# Patient Record
Sex: Female | Born: 1950 | Race: Black or African American | Hispanic: No | Marital: Married | Smoking: Never smoker
Health system: Southern US, Community
[De-identification: ages and names within clinical notes are randomized; demographics above are authoritative.]

## PROBLEM LIST (undated history)

## (undated) DIAGNOSIS — J45909 Unspecified asthma, uncomplicated: Secondary | ICD-10-CM

## (undated) DIAGNOSIS — G4730 Sleep apnea, unspecified: Secondary | ICD-10-CM

## (undated) DIAGNOSIS — E079 Disorder of thyroid, unspecified: Secondary | ICD-10-CM

## (undated) DIAGNOSIS — M1990 Unspecified osteoarthritis, unspecified site: Secondary | ICD-10-CM

## (undated) DIAGNOSIS — I10 Essential (primary) hypertension: Secondary | ICD-10-CM

## (undated) DIAGNOSIS — D649 Anemia, unspecified: Secondary | ICD-10-CM

## (undated) DIAGNOSIS — E785 Hyperlipidemia, unspecified: Secondary | ICD-10-CM

## (undated) DIAGNOSIS — T7840XA Allergy, unspecified, initial encounter: Secondary | ICD-10-CM

## (undated) DIAGNOSIS — H269 Unspecified cataract: Secondary | ICD-10-CM

## (undated) HISTORY — DX: Unspecified osteoarthritis, unspecified site: M19.90

## (undated) HISTORY — PX: COLONOSCOPY: SHX174

## (undated) HISTORY — DX: Hyperlipidemia, unspecified: E78.5

## (undated) HISTORY — DX: Anemia, unspecified: D64.9

## (undated) HISTORY — DX: Unspecified cataract: H26.9

## (undated) HISTORY — PX: KNEE SURGERY: SHX244

## (undated) HISTORY — DX: Allergy, unspecified, initial encounter: T78.40XA

## (undated) HISTORY — PX: CARPAL TUNNEL RELEASE: SHX101

## (undated) HISTORY — PX: ABDOMINAL HYSTERECTOMY: SHX81

## (undated) HISTORY — PX: SHOULDER SURGERY: SHX246

---

## 2010-04-01 ENCOUNTER — Emergency Department (HOSPITAL_COMMUNITY): Admission: EM | Admit: 2010-04-01 | Discharge: 2010-04-01 | Payer: Self-pay | Admitting: Emergency Medicine

## 2012-08-15 ENCOUNTER — Encounter (HOSPITAL_COMMUNITY): Payer: Self-pay | Admitting: Emergency Medicine

## 2012-08-15 ENCOUNTER — Emergency Department (HOSPITAL_COMMUNITY): Payer: Medicare HMO

## 2012-08-15 ENCOUNTER — Emergency Department (HOSPITAL_COMMUNITY)
Admission: EM | Admit: 2012-08-15 | Discharge: 2012-08-15 | Disposition: A | Discharge status: Home / Self Care | Payer: Medicare HMO | Attending: Emergency Medicine | Admitting: Emergency Medicine

## 2012-08-15 DIAGNOSIS — Z79899 Other long term (current) drug therapy: Secondary | ICD-10-CM | POA: Insufficient documentation

## 2012-08-15 DIAGNOSIS — J45909 Unspecified asthma, uncomplicated: Secondary | ICD-10-CM | POA: Insufficient documentation

## 2012-08-15 DIAGNOSIS — I10 Essential (primary) hypertension: Secondary | ICD-10-CM | POA: Insufficient documentation

## 2012-08-15 DIAGNOSIS — M79609 Pain in unspecified limb: Secondary | ICD-10-CM

## 2012-08-15 DIAGNOSIS — M62838 Other muscle spasm: Secondary | ICD-10-CM | POA: Insufficient documentation

## 2012-08-15 HISTORY — DX: Sleep apnea, unspecified: G47.30

## 2012-08-15 HISTORY — DX: Essential (primary) hypertension: I10

## 2012-08-15 LAB — CBC WITH DIFFERENTIAL/PLATELET
Basophils Absolute: 0 10*3/uL (ref 0.0–0.1)
Basophils Relative: 0 % (ref 0–1)
MCHC: 34.3 g/dL (ref 30.0–36.0)
Monocytes Absolute: 0.5 10*3/uL (ref 0.1–1.0)
Neutro Abs: 4.3 10*3/uL (ref 1.7–7.7)
Neutrophils Relative %: 58 % (ref 43–77)
RDW: 13.3 % (ref 11.5–15.5)

## 2012-08-15 LAB — POCT I-STAT, CHEM 8
Calcium, Ion: 1.36 mmol/L — ABNORMAL HIGH (ref 1.13–1.30)
Chloride: 104 mEq/L (ref 96–112)
HCT: 40 % (ref 36.0–46.0)
Potassium: 4.7 mEq/L (ref 3.5–5.1)

## 2012-08-15 LAB — POCT I-STAT TROPONIN I: Troponin i, poc: 0 ng/mL (ref 0.00–0.08)

## 2012-08-15 MED ORDER — IBUPROFEN 600 MG PO TABS: 600.0000 mg | ORAL_TABLET | Freq: Three times a day (TID) | ORAL | Status: DC | PRN

## 2012-08-15 MED ORDER — CYCLOBENZAPRINE HCL 10 MG PO TABS: 10.0000 mg | ORAL_TABLET | Freq: Three times a day (TID) | ORAL | Status: DC | PRN

## 2012-08-15 MED ORDER — IBUPROFEN 800 MG PO TABS
800.0000 mg | ORAL_TABLET | Freq: Once | ORAL | Status: AC
  Administered 2012-08-15: 800 mg via ORAL
  Filled 2012-08-15: qty 1

## 2012-08-15 MED ORDER — CYCLOBENZAPRINE HCL 10 MG PO TABS
10.0000 mg | ORAL_TABLET | Freq: Once | ORAL | Status: AC
  Administered 2012-08-15: 10 mg via ORAL
  Filled 2012-08-15: qty 1

## 2012-08-15 NOTE — ED Provider Notes (Signed)
History     CSN: [id number]  Arrival date & time 08/15/12  1213   First MD Initiated Contact with Patient 08/15/12 1317      Chief Complaint  Patient presents with  . Shoulder Pain    left side  . Leg Pain    left leg    (Consider location/radiation/quality/duration/timing/severity/associated sxs/prior treatment) HPI  Patient relates she started having pressure in her left posterior shoulder blade yesterday. She states the discomfort is constant. She states sometimes she has tingling in her left fingers. She also complains of pain in her left leg and points to her upper calf. She states it's harder to walk because of pain. She denies any low back pain. She denies headache, chest pain, or fever. She has had a cough for months. She states she has shortness of breath with the pain. She has a history of asthma in states she's having DOE.   PCP [facility] in [place]  Past Medical History  Diagnosis Date  . Hypertension   . Sleep apnea    asthma  Past Surgical History  Procedure Laterality Date  . Shoulder surgery    . Knee surgery    . Abdominal hysterectomy    . Carpal tunnel release      No family history on file.  History  Substance Use Topics  . Smoking status: Never Smoker   . Smokeless tobacco: Not on file  . Alcohol Use: No  Lives at home Lives with grandson CPAP at night   OB History   Grav Para Term Preterm Abortions TAB SAB Ect Mult Living                  Review of Systems  All other systems reviewed and are negative.    Allergies  Aspirin; Codeine; and Penicillins  Home Medications   Current Outpatient Rx  Name  Route  Sig  Dispense  Refill  . Cholecalciferol (VITAMIN D-3) 1000 UNITS CAPS   Oral   Take 1 capsule by mouth daily.         . fluticasone (FLOVENT HFA) 110 MCG/ACT inhaler   Inhalation   Inhale 1 puff into the lungs 2 (two) times daily as needed (for shortness of breath).         Marland Kitchen levothyroxine  (SYNTHROID, LEVOTHROID) 200 MCG tablet   Oral   Take 200 mcg by mouth daily.         Marland Kitchen levothyroxine (SYNTHROID, LEVOTHROID) 50 MCG tablet   Oral   Take 50 mcg by mouth daily.         Marland Kitchen lisinopril-hydrochlorothiazide (PRINZIDE,ZESTORETIC) 10-12.5 MG per tablet   Oral   Take 1 tablet by mouth daily.           BP 160/90  Pulse 82  Temp(Src) 98.3 F (36.8 C) (Oral)  Resp 18  SpO2 99%  Vital signs normal    Physical Exam  Nursing note and vitals reviewed. Constitutional: She is oriented to person, place, and time. She appears well-developed and well-nourished.  Non-toxic appearance. She does not appear ill. No distress.  HENT:  Head: Normocephalic and atraumatic.  Right Ear: External ear normal.  Left Ear: External ear normal.  Nose: Nose normal. No mucosal edema or rhinorrhea.  Mouth/Throat: Oropharynx is clear and moist and mucous membranes are normal. No dental abscesses or edematous.  Eyes: Conjunctivae and EOM are normal. Pupils are equal, round, and reactive to light.  Neck: Normal range of motion  and full passive range of motion without pain. Neck supple.    Cardiovascular: Normal rate, regular rhythm and normal heart sounds.  Exam reveals no gallop and no friction rub.   No murmur heard. Pulmonary/Chest: Effort normal and breath sounds normal. No respiratory distress. She has no wheezes. She has no rhonchi. She has no rales. She exhibits no tenderness and no crepitus.  Abdominal: Soft. Normal appearance and bowel sounds are normal. She exhibits no distension. There is no tenderness. There is no rebound and no guarding.  Musculoskeletal: Normal range of motion. She exhibits no edema and no tenderness.       Back:       Legs: Moves all extremities well. Has some varicose veins, tender in her prox left calf without cords, tender over the course of the trapezius on the left  Nontender in the midline thoracic or lumbar spine. Also nontender in the cervical spine.    Neurological: She is alert and oriented to person, place, and time. She has normal strength. No cranial nerve deficit.  Skin: Skin is warm, dry and intact. No rash noted. No erythema. No pallor.  Psychiatric: She has a normal mood and affect. Her speech is normal and behavior is normal. Her mood appears not anxious.    ED Course  Procedures (including critical care time)  Medications  cyclobenzaprine (FLEXERIL) tablet 10 mg (10 mg Oral Given 08/15/12 1528)  ibuprofen (ADVIL,MOTRIN) tablet 800 mg (800 mg Oral Given 08/15/12 1529)   Pt states her pain is better.       VASCULAR LAB  PRELIMINARY PRELIMINARY PRELIMINARY PRELIMINARY  Left lower extremity venous Doppler completed.  Preliminary report: There is no obvious evidence of DVT or SVT noted in the left lower extremity.  KANADY, CANDACE, RVT  08/15/2012, 4:25 PM        Results for orders placed during the hospital encounter of 08/15/12  CBC WITH DIFFERENTIAL      Result Value Range   WBC 7.3  4.0 - 10.5 K/uL   RBC 4.59  3.87 - 5.11 MIL/uL   Hemoglobin 13.6  12.0 - 15.0 g/dL   HCT 04.5  40.9 - 81.1 %   MCV 86.3  78.0 - 100.0 fL   MCH 29.6  26.0 - 34.0 pg   MCHC 34.3  30.0 - 36.0 g/dL   RDW 91.4  78.2 - 95.6 %   Platelets 254  150 - 400 K/uL   Neutrophils Relative 58  43 - 77 %   Neutro Abs 4.3  1.7 - 7.7 K/uL   Lymphocytes Relative 34  12 - 46 %   Lymphs Abs 2.5  0.7 - 4.0 K/uL   Monocytes Relative 6  3 - 12 %   Monocytes Absolute 0.5  0.1 - 1.0 K/uL   Eosinophils Relative 1  0 - 5 %   Eosinophils Absolute 0.1  0.0 - 0.7 K/uL   Basophils Relative 0  0 - 1 %   Basophils Absolute 0.0  0.0 - 0.1 K/uL  POCT I-STAT, CHEM 8      Result Value Range   Sodium 142  135 - 145 mEq/L   Potassium 4.7  3.5 - 5.1 mEq/L   Chloride 104  96 - 112 mEq/L   BUN 15  6 - 23 mg/dL   Creatinine, Ser 2.13  0.50 - 1.10 mg/dL   Glucose, Bld 95  70 - 99 mg/dL   Calcium, Ion 0.86 (*) 1.13 - 1.30 mmol/L   TCO2  37  0 - 100 mmol/L    Hemoglobin 13.6  12.0 - 15.0 g/dL   HCT 16.1  09.6 - 04.5 %  POCT I-STAT TROPONIN I      Result Value Range   Troponin i, poc 0.00  0.00 - 0.08 ng/mL   Comment 3             Laboratory interpretation all normal   Dg Chest 2 View  08/15/2012  *RADIOLOGY REPORT*  Clinical Data: Cough and left-sided pain.  CHEST - 2 VIEW  Comparison: None  Findings: Upper limits normal heart size noted. The right hilar region is mildly prominent - suspect overlapping vascular structures. Minimal left basilar atelectasis is noted. There is no evidence of focal airspace disease, pulmonary edema, suspicious pulmonary nodule/mass, pleural effusion, or pneumothorax. No acute bony abnormalities are identified.  IMPRESSION: Minimal left basilar atelectasis.  Mildly prominent right hilar region - suspect overlapping vascular structures.  Recommend short-term radiographic follow up.   Original Report Authenticated By: Harmon Pier, M.D.        Date: 08/15/2012  Rate: 75  Rhythm: normal sinus rhythm  QRS Axis: left  Intervals: normal  ST/T Wave abnormalities: normal  Conduction Disutrbances:none  Narrative Interpretation:   Old EKG Reviewed: none available    1. Trapezius muscle spasm     New Prescriptions   CYCLOBENZAPRINE (FLEXERIL) 10 MG TABLET    Take 1 tablet (10 mg total) by mouth 3 (three) times daily as needed for muscle spasms.   IBUPROFEN (ADVIL,MOTRIN) 600 MG TABLET    Take 1 tablet (600 mg total) by mouth every 8 (eight) hours as needed for pain.   Plan discharge  Devoria Albe, MD, FACEP   MDM          Ward Givens, MD 08/15/12 [phone]

## 2012-08-15 NOTE — ED Notes (Signed)
Pt c/o left shoulder and left leg pain onset yesterday. Pt also c/o numbness and tingling to fingers on left hand. Pt just started on BP meds recently.

## 2012-08-15 NOTE — ED Notes (Signed)
Doppler at bedside.

## 2012-08-15 NOTE — ED Notes (Signed)
Pt dc'd home w/all belongings, pt alert and ambulatory upon dc, pt verbalizes understanding of dc instructions, pt driven home by family. 2 new rx given

## 2012-08-15 NOTE — ED Notes (Signed)
Patient transported to X-ray 

## 2012-08-15 NOTE — Progress Notes (Signed)
VASCULAR LAB PRELIMINARY  PRELIMINARY  PRELIMINARY  PRELIMINARY  Left lower extremity venous Doppler completed.    Preliminary report:  There is no obvious evidence of DVT or SVT noted in the left lower extremity.  Micky Overturf, RVT 08/15/2012, 4:25 PM

## 2013-02-03 ENCOUNTER — Encounter (HOSPITAL_COMMUNITY): Payer: Self-pay | Admitting: Emergency Medicine

## 2013-02-03 DIAGNOSIS — R197 Diarrhea, unspecified: Secondary | ICD-10-CM | POA: Insufficient documentation

## 2013-02-03 DIAGNOSIS — R1110 Vomiting, unspecified: Secondary | ICD-10-CM | POA: Insufficient documentation

## 2013-02-03 DIAGNOSIS — I10 Essential (primary) hypertension: Secondary | ICD-10-CM | POA: Insufficient documentation

## 2013-02-03 DIAGNOSIS — M109 Gout, unspecified: Secondary | ICD-10-CM | POA: Insufficient documentation

## 2013-02-03 DIAGNOSIS — E039 Hypothyroidism, unspecified: Secondary | ICD-10-CM | POA: Insufficient documentation

## 2013-02-03 DIAGNOSIS — Z79899 Other long term (current) drug therapy: Secondary | ICD-10-CM | POA: Insufficient documentation

## 2013-02-03 DIAGNOSIS — Z8669 Personal history of other diseases of the nervous system and sense organs: Secondary | ICD-10-CM | POA: Insufficient documentation

## 2013-02-03 DIAGNOSIS — M79609 Pain in unspecified limb: Secondary | ICD-10-CM | POA: Insufficient documentation

## 2013-02-03 NOTE — ED Notes (Signed)
Pt. reports nausea with vomitting and diarrhea last Wednesday and left big toe pain with swelling onset today .

## 2013-02-04 ENCOUNTER — Emergency Department (HOSPITAL_COMMUNITY)
Admission: EM | Admit: 2013-02-04 | Discharge: 2013-02-04 | Disposition: A | Discharge status: Home / Self Care | Payer: Medicare HMO | Attending: Emergency Medicine | Admitting: Emergency Medicine

## 2013-02-04 ENCOUNTER — Emergency Department (HOSPITAL_COMMUNITY): Payer: Medicare HMO

## 2013-02-04 DIAGNOSIS — M79675 Pain in left toe(s): Secondary | ICD-10-CM

## 2013-02-04 DIAGNOSIS — M109 Gout, unspecified: Secondary | ICD-10-CM

## 2013-02-04 HISTORY — DX: Unspecified asthma, uncomplicated: J45.909

## 2013-02-04 HISTORY — DX: Disorder of thyroid, unspecified: E07.9

## 2013-02-04 MED ORDER — OXYCODONE-ACETAMINOPHEN 5-325 MG PO TABS
2.0000 | ORAL_TABLET | Freq: Once | ORAL | Status: AC
  Administered 2013-02-04: 2 via ORAL
  Filled 2013-02-04: qty 2

## 2013-02-04 MED ORDER — PREDNISONE 20 MG PO TABS
60.0000 mg | ORAL_TABLET | Freq: Once | ORAL | Status: AC
  Administered 2013-02-04: 60 mg via ORAL

## 2013-02-04 MED ORDER — OXYCODONE-ACETAMINOPHEN 5-325 MG PO TABS: 1.0000 | ORAL_TABLET | ORAL | Status: DC | PRN

## 2013-02-04 MED ORDER — PREDNISONE 20 MG PO TABS: 60.0000 mg | ORAL_TABLET | Freq: Every day | ORAL | Status: DC

## 2013-02-04 NOTE — ED Provider Notes (Signed)
TIME SEEN: 12:11AM  CHIEF COMPLAINT: Left great toe pain  HPI: Patient is a 62 y.o. female with a history of hypothyroidism, hypertension who presents the emergency department with complaints of left great toe pain that started earlier today. No known injury. She's never had similar symptoms. No prior history of gout. No fever. She did have vomiting and diarrhea last week but has been resolved for the past 3 days. No abdominal pain. No numbness, tingling or focal weakness. No joint swelling.  ROS: See HPI Constitutional: no fever  Eyes: no drainage  ENT: no runny nose   Cardiovascular:  no chest pain  Resp: no SOB  GI: no vomiting GU: no dysuria Integumentary: no rash  Allergy: no hives  Musculoskeletal: no leg swelling  Neurological: no slurred speech ROS otherwise negative  PAST MEDICAL HISTORY/PAST SURGICAL HISTORY:  Past Medical History  Diagnosis Date  . Hypertension   . Sleep apnea   . Thyroid disease   . Asthma     MEDICATIONS:  Prior to Admission medications   Medication Sig Start Date End Date Taking? Authorizing Provider  Cholecalciferol (VITAMIN D-3) 1000 UNITS CAPS Take 1 capsule by mouth daily.    Historical Provider, MD  cyclobenzaprine (FLEXERIL) 10 MG tablet Take 1 tablet (10 mg total) by mouth 3 (three) times daily as needed for muscle spasms. 08/15/12   Charmaine Placido Givens, MD  fluticasone (FLOVENT HFA) 110 MCG/ACT inhaler Inhale 1 puff into the lungs 2 (two) times daily as needed (for shortness of breath).    Historical Provider, MD  ibuprofen (ADVIL,MOTRIN) 600 MG tablet Take 1 tablet (600 mg total) by mouth every 8 (eight) hours as needed for pain. 08/15/12   Griff Badley Givens, MD  levothyroxine (SYNTHROID, LEVOTHROID) 200 MCG tablet Take 200 mcg by mouth daily.    Historical Provider, MD  levothyroxine (SYNTHROID, LEVOTHROID) 50 MCG tablet Take 50 mcg by mouth daily.    Historical Provider, MD  lisinopril-hydrochlorothiazide (PRINZIDE,ZESTORETIC) 10-12.5 MG per tablet Take  1 tablet by mouth daily.    Historical Provider, MD    ALLERGIES:  Allergies  Allergen Reactions  . Aspirin     swelling  . Codeine Other (See Comments)    Unknown   . Penicillins Other (See Comments)    Childhood reaction    SOCIAL HISTORY:  History  Substance Use Topics  . Smoking status: Never Smoker   . Smokeless tobacco: Not on file  . Alcohol Use: No    FAMILY HISTORY: No family history on file.  EXAM: BP 156/69  Pulse 79  Temp(Src) 98.7 F (37.1 C) (Oral)  Resp 16  Ht 5\' 2"  (1.575 m)  Wt 269 lb (122.018 kg)  BMI 49.19 kg/m2  SpO2 96% CONSTITUTIONAL: Alert and oriented and responds appropriately to questions. Well-appearing; well-nourished HEAD: Normocephalic EYES: Conjunctivae clear, PERRL ENT: normal nose; no rhinorrhea; moist mucous membranes; pharynx without lesions noted NECK: Supple, no meningismus, no LAD  CARD: RRR; S1 and S2 appreciated; no murmurs, no clicks, no rubs, no gallops RESP: Normal chest excursion without splinting or tachypnea; breath sounds clear and equal bilaterally; no wheezes, no rhonchi, no rales,  ABD/GI: Normal bowel sounds; non-distended; soft, non-tender, no rebound, no guarding BACK:  The back appears normal and is non-tender to palpation, there is no CVA tenderness EXT: Tenderness palpation over the first MTP with no joint effusion, very mild erythema and warmth, mild pain with movement of her first MTP, normal sensation, normal capillary refill, 2+ DP pulses, otherwise  Normal ROM in all joints; non-tender to palpation; no edema; normal capillary refill; no cyanosis    SKIN: Normal color for age and race; warm NEURO: Moves all extremities equally PSYCH: The patient's mood and manner are appropriate. Grooming and personal hygiene are appropriate.  MEDICAL DECISION MAKING: Patient likely with gallop of her first MTP. Her exam is otherwise benign. I am not concerned for septic joint at this time. Will obtain x-ray and give pain  medication. Anticipate discharge home with close outpatient followup.  ED PROGRESS: X-ray shows mild MTP narrowing. No obvious effusion. No bony abnormality. I was able to infiltrate approximately 5 ML's of lidocaine into the joint and was unable to aspirate any joint fluid to be sent for analysis. I suspect this is likely gouty arthritis. Given return precautions. We'll discharge with pain medication and crutches. We'll have her followup with her primary care physician. Patient verbalizes understanding is comfortable this plan.   ARTHOCENTESIS Performed by: Raelyn Number Consent: Verbal consent obtained. Risks and benefits: risks, benefits and alternatives were discussed Consent given by: patient Required items: required blood products, implants, devices, and special equipment available Patient identity confirmed: verbally with patient Time out: Immediately prior to procedure a "time out" was called to verify the correct patient, procedure, equipment, support staff and site/side marked as required. Indications: To evaluate for gouty arthritis versus septic arthritis Joint: First left MTP Local anesthe 1% Xylocaine without epinephrinesia used:   Preparation: Patient was prepped and draped in the usual sterile fashion. Aspirate appearance: unable to aspirate any joint fluid Patient tolerance: Patient tolerated the procedure well with no immediate complications.    Layla [name] Lyndon Chapel, DO 02/04/13 [phone]

## 2013-02-04 NOTE — ED Notes (Signed)
Discharge inst given voiced understanding. 

## 2013-02-04 NOTE — ED Provider Notes (Signed)
Patient reports an allergy to NSAIDs. We'll discharge home on Medrol Dosepak for gouty arthritis.  Sonya [name] [name] [name], DO 02/04/13 [phone]

## 2014-12-20 ENCOUNTER — Other Ambulatory Visit: Payer: Self-pay | Admitting: Orthopedic Surgery

## 2014-12-20 DIAGNOSIS — S42152B Displaced fracture of neck of scapula, left shoulder, initial encounter for open fracture: Principal | ICD-10-CM

## 2014-12-20 DIAGNOSIS — S42142B Displaced fracture of glenoid cavity of scapula, left shoulder, initial encounter for open fracture: Secondary | ICD-10-CM

## 2014-12-22 ENCOUNTER — Ambulatory Visit
Admission: RE | Admit: 2014-12-22 | Discharge: 2014-12-22 | Disposition: A | Discharge status: Home / Self Care | Payer: Medicare HMO | Source: Ambulatory Visit | Attending: Orthopedic Surgery | Admitting: Orthopedic Surgery

## 2014-12-22 DIAGNOSIS — S42152B Displaced fracture of neck of scapula, left shoulder, initial encounter for open fracture: Principal | ICD-10-CM

## 2014-12-22 DIAGNOSIS — S42142B Displaced fracture of glenoid cavity of scapula, left shoulder, initial encounter for open fracture: Secondary | ICD-10-CM

## 2016-07-01 ENCOUNTER — Encounter: Payer: Self-pay | Admitting: Gastroenterology

## 2016-08-19 ENCOUNTER — Ambulatory Visit (AMBULATORY_SURGERY_CENTER): Payer: Self-pay

## 2016-08-19 VITALS — Ht 62.0 in | Wt 257.0 lb

## 2016-08-19 DIAGNOSIS — Z1211 Encounter for screening for malignant neoplasm of colon: Secondary | ICD-10-CM

## 2016-08-19 MED ORDER — NA SULFATE-K SULFATE-MG SULF 17.5-3.13-1.6 GM/177ML PO SOLN: 1.0000 | Freq: Once | ORAL | 0 refills | Status: AC

## 2016-08-19 NOTE — Progress Notes (Signed)
Denies allergies to eggs or soy products. Denies complication of anesthesia or sedation. Denies use of weight loss medication. Denies use of O2.   Emmi instructions given for colonoscopy.  

## 2016-08-20 ENCOUNTER — Encounter: Payer: Self-pay | Admitting: Gastroenterology

## 2016-08-26 ENCOUNTER — Ambulatory Visit (AMBULATORY_SURGERY_CENTER): Payer: Medicare HMO | Admitting: Gastroenterology

## 2016-08-26 ENCOUNTER — Encounter: Payer: Self-pay | Admitting: Gastroenterology

## 2016-08-26 VITALS — BP 98/64 | HR 62 | Temp 98.6°F | Resp 46 | Ht 62.0 in | Wt 257.0 lb

## 2016-08-26 DIAGNOSIS — R195 Other fecal abnormalities: Secondary | ICD-10-CM

## 2016-08-26 MED ORDER — SODIUM CHLORIDE 0.9 % IV SOLN: 500.0000 mL | INTRAVENOUS | Status: AC

## 2016-08-26 NOTE — Progress Notes (Signed)
Pt's states no medical or surgical changes since previsit or office visit. 

## 2016-08-26 NOTE — Op Note (Signed)
[facility] Patient Name: Sonya Gibbs Procedure Date: 08/26/2016 1:56 PM MRN: [id number] Endoscopist: Viviann Spare P. Darryel Diodato MD, MD Age: 66 Referring MD:  Date of Birth: April 28, 1951 Gender: Female Account #: [id number] Procedure:                Colonoscopy Indications:              Positive fecal immunochemical test Medicines:                Monitored Anesthesia Care Procedure:                Pre-Anesthesia Assessment:                           - Prior to the procedure, a History and Physical                            was performed, and patient medications and                            allergies were reviewed. The patient's tolerance of                            previous anesthesia was also reviewed. The risks                            and benefits of the procedure and the sedation                            options and risks were discussed with the patient.                            All questions were answered, and informed consent                            was obtained. Prior Anticoagulants: The patient has                            taken no previous anticoagulant or antiplatelet                            agents. ASA Grade Assessment: III - A patient with                            severe systemic disease. After reviewing the risks                            and benefits, the patient was deemed in                            satisfactory condition to undergo the procedure.                           After obtaining informed consent, the colonoscope  was passed under direct vision. Throughout the                            procedure, the patient's blood pressure, pulse, and                            oxygen saturations were monitored continuously. The                            Model PCF-[id number]) scope was introduced                            through the anus and advanced to the the terminal                            ileum, with  identification of the appendiceal                            orifice and IC valve. The colonoscopy was performed                            without difficulty. The patient tolerated the                            procedure well. The quality of the bowel                            preparation was good. The terminal ileum, ileocecal                            valve, appendiceal orifice, and rectum were                            photographed. Scope In: 2:06:09 PM Scope Out: 2:18:52 PM Scope Withdrawal Time: 0 hours 10 minutes 52 seconds  Total Procedure Duration: 0 hours 12 minutes 43 seconds  Findings:                 The perianal exam findings include non-thrombosed                            external hemorrhoids.                           Many small and large-mouthed diverticula were found                            in the transverse colon and left colon.                           Internal hemorrhoids were found during retroflexion.                           The terminal ileum appeared normal.  The exam was otherwise without abnormality. No                            polyps Complications:            No immediate complications. Estimated blood loss:                            None. Estimated Blood Loss:     Estimated blood loss: none. Impression:               - Non-thrombosed external hemorrhoids found on                            perianal exam.                           - Diverticulosis in the transverse colon and in the                            left colon.                           - Internal hemorrhoids.                           - The examined portion of the ileum was normal.                           - The examination was otherwise normal. Recommendation:           - Patient has a contact number available for                            emergencies. The signs and symptoms of potential                            delayed complications were discussed with  the                            patient. Return to normal activities tomorrow.                            Written discharge instructions were provided to the                            patient.                           - Resume previous diet.                           - Continue present medications.                           - Repeat colonoscopy in 10 years for screening  purposes.                           - No further stool tests needed for screening                            prurposes Lenwood Balsam P. Erick Murin MD, MD 08/26/2016 2:23:18 PM This report has been signed electronically.

## 2016-08-26 NOTE — Progress Notes (Signed)
To recovery, report to Oliver, RN, VSS 

## 2016-08-26 NOTE — Patient Instructions (Addendum)
Handouts given on diverticulosis, hemorrhoids and high fiber diet  YOU HAD AN ENDOSCOPIC PROCEDURE TODAY: Refer to the procedure report and other information in the discharge instructions given to you for any specific questions about what was found during the examination. If this information does not answer your questions, please call [place] office at [phone] to clarify.   YOU SHOULD EXPECT: Some feelings of bloating in the abdomen. Passage of more gas than usual. Walking can help get rid of the air that was put into your GI tract during the procedure and reduce the bloating. If you had a lower endoscopy (such as a colonoscopy or flexible sigmoidoscopy) you may notice spotting of blood in your stool or on the toilet paper. Some abdominal soreness may be present for a day or two, also.  DIET: Your first meal following the procedure should be a light meal and then it is ok to progress to your normal diet. A half-sandwich or bowl of soup is an example of a good first meal. Heavy or fried foods are harder to digest and may make you feel nauseous or bloated. Drink plenty of fluids but you should avoid alcoholic beverages for 24 hours. If you had a esophageal dilation, please see attached instructions for diet.    ACTIVITY: Your care partner should take you home directly after the procedure. You should plan to take it easy, moving slowly for the rest of the day. You can resume normal activity the day after the procedure however YOU SHOULD NOT DRIVE, use power tools, machinery or perform tasks that involve climbing or major physical exertion for 24 hours (because of the sedation medicines used during the test).   SYMPTOMS TO REPORT IMMEDIATELY: A gastroenterologist can be reached at any hour. Please call [phone]  for any of the following symptoms:  Following lower endoscopy (colonoscopy, flexible sigmoidoscopy) Excessive amounts of blood in the stool  Significant tenderness, worsening of abdominal  pains  Swelling of the abdomen that is new, acute  Fever of 100 or higher    FOLLOW UP:  If any biopsies were taken you will be contacted by phone or by letter within the next 1-3 weeks. Call [phone]  if you have not heard about the biopsies in 3 weeks.  Please also call with any specific questions about appointments or follow up tests.

## 2016-08-27 ENCOUNTER — Telehealth: Payer: Self-pay

## 2016-08-27 NOTE — Telephone Encounter (Signed)
  Follow up Call-  Call back number 08/26/2016  Post procedure Call Back phone  # [phone] cell   Permission to leave phone message Yes  Some recent data might be hidden     Patient questions:  Do you have a fever, pain , or abdominal swelling? No. Pain Score  0 *  Have you tolerated food without any problems? Yes.    Have you been able to return to your normal activities? Yes.    Do you have any questions about your discharge instructions: Diet   No. Medications  No. Follow up visit  No.  Do you have questions or concerns about your Care? No.  Actions: * If pain score is 4 or above: No action needed, pain <4.

## 2017-07-21 ENCOUNTER — Other Ambulatory Visit: Payer: Self-pay | Admitting: Internal Medicine

## 2017-07-21 ENCOUNTER — Ambulatory Visit
Admission: RE | Admit: 2017-07-21 | Discharge: 2017-07-21 | Disposition: A | Discharge status: Home / Self Care | Payer: No Typology Code available for payment source | Source: Ambulatory Visit | Attending: Internal Medicine | Admitting: Internal Medicine

## 2017-07-21 DIAGNOSIS — Z111 Encounter for screening for respiratory tuberculosis: Secondary | ICD-10-CM

## 2018-07-26 ENCOUNTER — Emergency Department (HOSPITAL_COMMUNITY)
Admission: EM | Admit: 2018-07-26 | Discharge: 2018-07-26 | Disposition: A | Discharge status: Home / Self Care | Payer: Medicare HMO | Attending: Emergency Medicine | Admitting: Emergency Medicine

## 2018-07-26 ENCOUNTER — Other Ambulatory Visit: Payer: Self-pay

## 2018-07-26 ENCOUNTER — Encounter (HOSPITAL_COMMUNITY): Payer: Self-pay | Admitting: Emergency Medicine

## 2018-07-26 DIAGNOSIS — M1A9XX0 Chronic gout, unspecified, without tophus (tophi): Secondary | ICD-10-CM | POA: Diagnosis not present

## 2018-07-26 DIAGNOSIS — M25571 Pain in right ankle and joints of right foot: Secondary | ICD-10-CM | POA: Diagnosis present

## 2018-07-26 DIAGNOSIS — M109 Gout, unspecified: Secondary | ICD-10-CM

## 2018-07-26 DIAGNOSIS — J45909 Unspecified asthma, uncomplicated: Secondary | ICD-10-CM | POA: Insufficient documentation

## 2018-07-26 DIAGNOSIS — I10 Essential (primary) hypertension: Secondary | ICD-10-CM | POA: Diagnosis not present

## 2018-07-26 DIAGNOSIS — Z79899 Other long term (current) drug therapy: Secondary | ICD-10-CM | POA: Insufficient documentation

## 2018-07-26 MED ORDER — PREDNISONE 20 MG PO TABS: ORAL_TABLET | ORAL | 0 refills | Status: AC

## 2018-07-26 MED ORDER — HYDROCODONE-ACETAMINOPHEN 5-325 MG PO TABS: 1.0000 | ORAL_TABLET | Freq: Four times a day (QID) | ORAL | 0 refills | Status: AC | PRN

## 2018-07-26 NOTE — ED Triage Notes (Signed)
Pt reports a flare up on her gout that started on Monday and then started to get more intense by this date. Pt reports taking gout medication and aleve without any relief.

## 2018-07-26 NOTE — ED Notes (Signed)
Patient verbalized understanding of discharge instructions and denies any further needs or questions at this time. VS stable. Patient ambulatory with steady gait using cane. Assisted to ED lobby in wheelchair.

## 2018-07-26 NOTE — ED Provider Notes (Signed)
[facility] EMERGENCY DEPARTMENT Provider Note   CSN: [id number] Arrival date & time: 07/26/18  1627    History   Chief Complaint Chief Complaint  Patient presents with  . Gout    HPI Sonya Gibbs is a 68 y.o. female.     HPI Patient reports she has having a gout flare.  Her pain is in her right ankle.  She reports is been going on for about 4 days. Past Medical History:  Diagnosis Date  . Allergy   . Anemia   . Arthritis   . Asthma   . Cataract   . Hyperlipidemia   . Hypertension   . Sleep apnea    on CPAP  . Thyroid disease     There are no active problems to display for this patient.   Past Surgical History:  Procedure Laterality Date  . ABDOMINAL HYSTERECTOMY    . CARPAL TUNNEL RELEASE    . COLONOSCOPY    . KNEE SURGERY    . SHOULDER SURGERY       OB History   No obstetric history on file.      Home Medications    Prior to Admission medications   Medication Sig Start Date End Date Taking? Authorizing Provider  Cholecalciferol (VITAMIN D-3) 1000 UNITS CAPS Take 1 capsule by mouth daily.    [provider]  fluticasone (FLOVENT HFA) 110 MCG/ACT inhaler Inhale 1 puff into the lungs 2 (two) times daily as needed (for shortness of breath).    [provider]  HYDROcodone-acetaminophen (NORCO/VICODIN) 5-325 MG tablet Take 1-2 tablets by mouth every 6 (six) hours as needed for moderate pain or severe pain. 07/26/18   Arby Barrette, MD  ibuprofen (ADVIL,MOTRIN) 200 MG tablet Take 200 mg by mouth every 6 (six) hours as needed.    [provider]  levothyroxine (SYNTHROID, LEVOTHROID) 200 MCG tablet Take 200 mcg by mouth daily.    [provider]  levothyroxine (SYNTHROID, LEVOTHROID) 50 MCG tablet Take 50 mcg by mouth daily.    [provider]  lisinopril-hydrochlorothiazide (PRINZIDE,ZESTORETIC) 10-12.5 MG per tablet Take 1 tablet by mouth daily.    [provider]  predniSONE  (DELTASONE) 20 MG tablet 3 tabs po day one, then 2 tabs daily x 4 days 07/26/18   Arby Barrette, MD    Family History Family History  Problem Relation Age of Onset  . Colon cancer Neg Hx   . Esophageal cancer Neg Hx   . Rectal cancer Neg Hx   . Stomach cancer Neg Hx     Social History Social History   Tobacco Use  . Smoking status: Never Smoker  . Smokeless tobacco: Never Used  Substance Use Topics  . Alcohol use: No  . Drug use: No     Allergies   Aspirin; Codeine; and Penicillins   Review of Systems Review of Systems Constitutional: No fever no chills no malaise Respiratory: No shortness of breath no chest pain  Physical Exam Updated Vital Signs BP 139/69 (BP Location: Right Arm)   Pulse 71   Temp 97.8 F (36.6 C) (Oral)   Resp 16   Ht 5\' 2"  (1.575 m)   Wt 115.2 kg   LMP  (Exact Date)   SpO2 96%   BMI 46.46 kg/m   Physical Exam Constitutional:      Comments: Alert nontoxic clinically well in appearance.  HENT:     Head: Normocephalic and atraumatic.  Cardiovascular:     Rate  and Rhythm: Normal rate and regular rhythm.  Pulmonary:     Effort: Pulmonary effort is normal.     Breath sounds: Normal breath sounds.  Musculoskeletal:     Comments: Patient has swelling at the ankle on the right.  Neurovascularly intact.  Dorsalis pulses 2+ and symmetric.  Calves are soft and not  Skin:    General: Skin is warm and dry.          ED Treatments / Results  Labs (all labs ordered are listed, but only abnormal results are displayed) Labs Reviewed - No data to display  EKG None  Radiology No results found.  Procedures Procedures (including critical care time)  Medications Ordered in ED Medications - No data to display   Initial Impression / Assessment and Plan / ED Course  I have reviewed the triage vital signs and the nursing notes.  Pertinent labs & imaging results that were available during my care of the patient were reviewed by me and  considered in my medical decision making (see chart for details).        Patient with right ankle pain and swelling consistent with prior gout exacerbations.  No signs of septic joint.  Neurovascularly intact.  Will treat with prednisone and Vicodin for pain control.  Recommended to follow-up with PCP.  Final Clinical Impressions(s) / ED Diagnoses   Final diagnoses:  Gout, arthritis    ED Discharge Orders         Ordered    predniSONE (DELTASONE) 20 MG tablet     07/26/18 1846    HYDROcodone-acetaminophen (NORCO/VICODIN) 5-325 MG tablet  Every 6 hours PRN     07/26/18 1846           Arby Barrette, MD 08/01/18 0023

## 2019-08-30 IMAGING — CR DG CHEST 1V
1 series · 1 of 1 positions shown · non-contrast
Comparison: 08/15/2012.

CLINICAL DATA: TB screening.  Positive blood test.

EXAM:
CHEST 1 VIEW

[w chest pa]
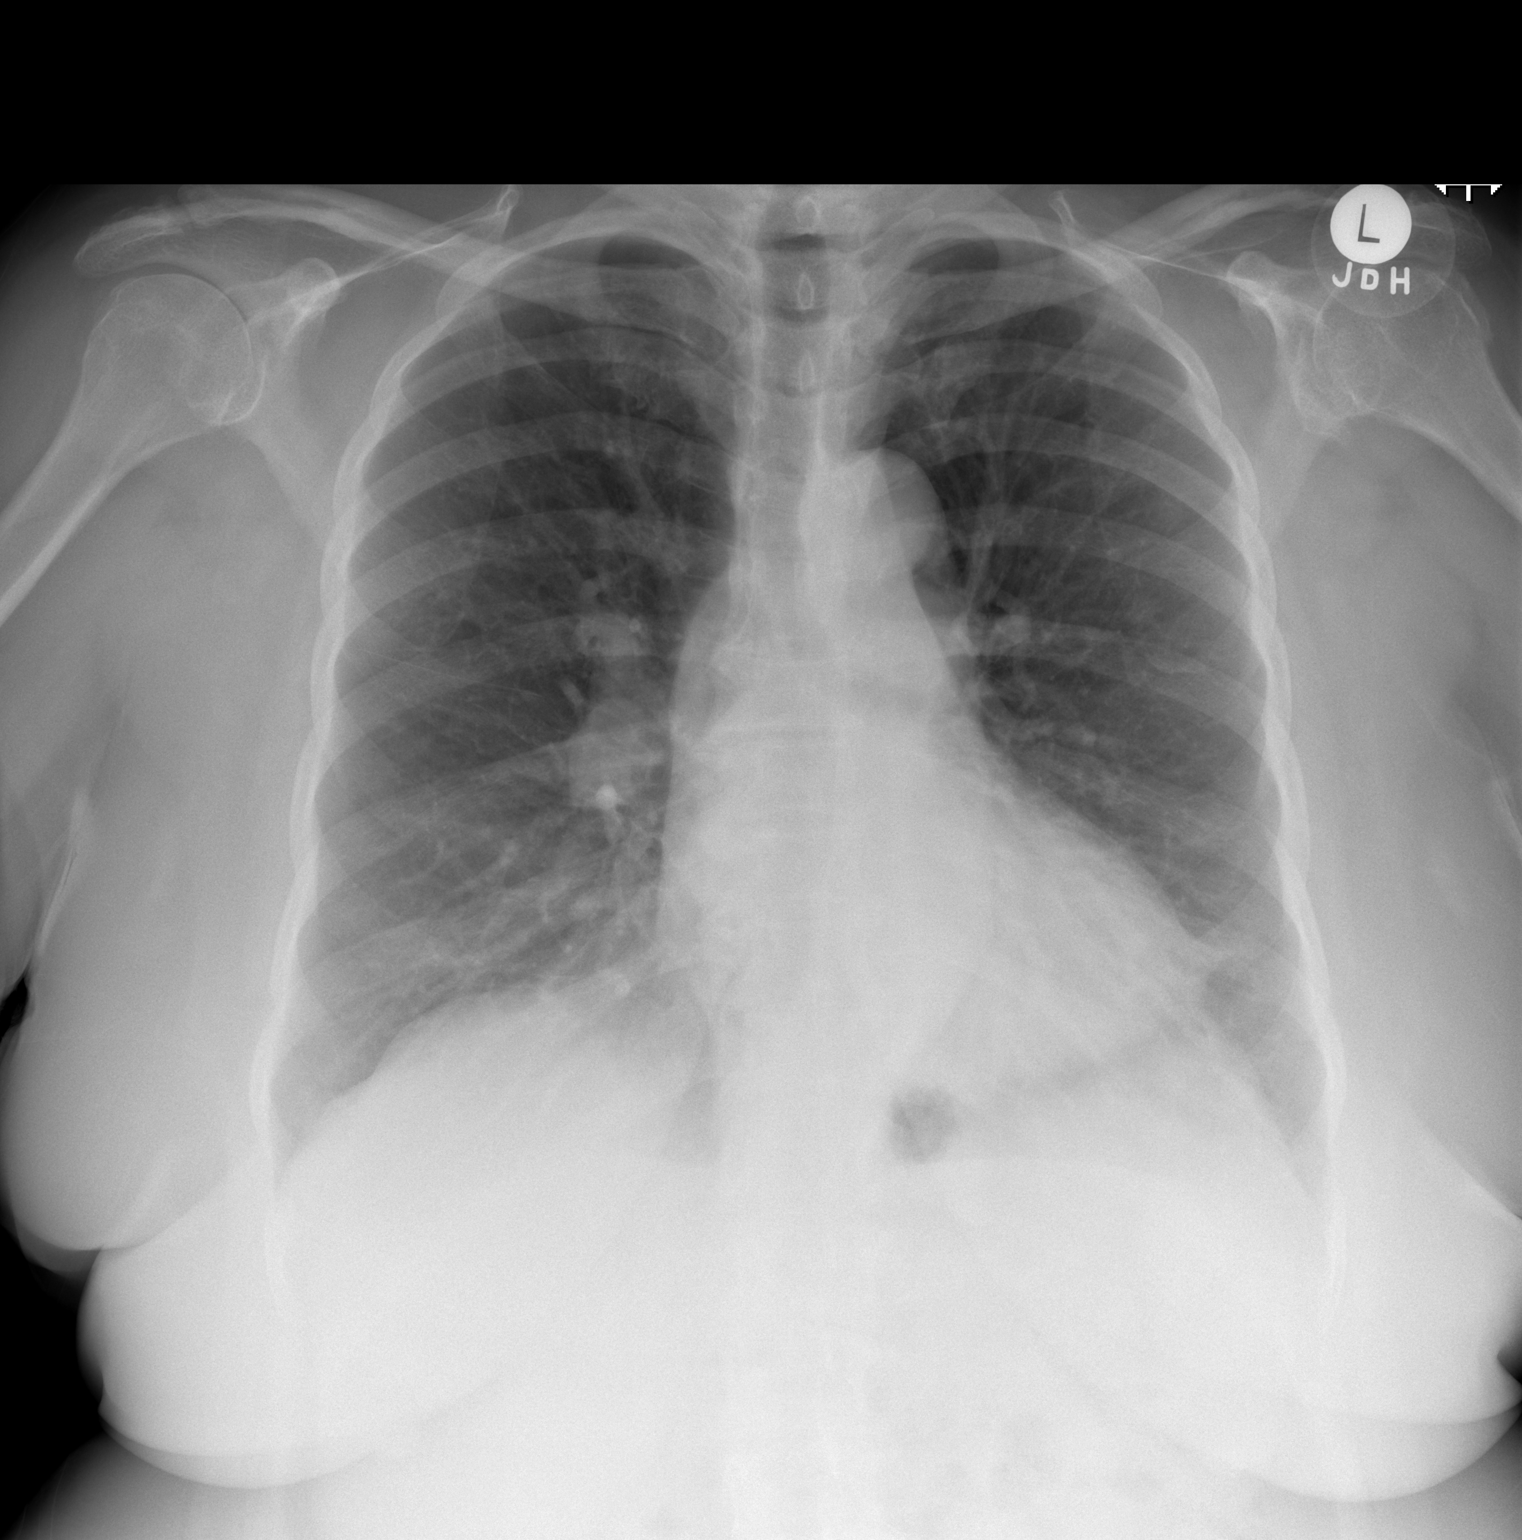

[1 of 1 positions shown; findings below may reference images not displayed]

FINDINGS: Mediastinum is stable. Stable right hilar fullness consistent
prominent vascularity. Lungs are clear. No pleural effusion or
pneumothorax.
IMPRESSION: No acute or focal cardiopulmonary disease.  No evidence of TB.
